# Patient Record
Sex: Male | Born: 1995 | Race: White | Hispanic: No | Marital: Single | Smoking: Current every day smoker
Health system: Southern US, Community
[De-identification: ages and names within clinical notes are randomized; demographics above are authoritative.]

---
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
## 2017-06-14 ENCOUNTER — Encounter (HOSPITAL_COMMUNITY): Payer: Self-pay | Admitting: *Deleted

## 2017-06-14 ENCOUNTER — Emergency Department (HOSPITAL_COMMUNITY)
Admission: EM | Admit: 2017-06-14 | Discharge: 2017-06-14 | Disposition: A | Discharge status: Home / Self Care | Payer: Self-pay | Attending: Emergency Medicine | Admitting: Emergency Medicine

## 2017-06-14 DIAGNOSIS — M545 Low back pain, unspecified: Secondary | ICD-10-CM

## 2017-06-14 DIAGNOSIS — F17200 Nicotine dependence, unspecified, uncomplicated: Secondary | ICD-10-CM | POA: Insufficient documentation

## 2017-06-14 MED ORDER — METHOCARBAMOL 500 MG PO TABS: 500.0000 mg | ORAL_TABLET | Freq: Two times a day (BID) | ORAL | 0 refills | Status: AC

## 2017-06-14 NOTE — Discharge Instructions (Addendum)
Please read attached information. If you experience any new or worsening signs or symptoms please return to the emergency room for evaluation. Please follow-up with your primary care provider or specialist as discussed. Please use medication prescribed only as directed and discontinue taking if you have any concerning signs or symptoms.   °

## 2017-06-14 NOTE — ED Provider Notes (Signed)
[facility] Note   CSN: [id number] Arrival date & time: 06/14/17  1101     History   Chief Complaint Chief Complaint  Patient presents with  . Back Pain    HPI Tommy Awkwardustin Campbell is a 22 y.o. male.  HPI  22 year old male presents today with complaints of right-sided back pain.  Patient notes approximately 1 week ago he woke up with right lower lateral lumbar and thoracic back pain.  He notes that he felt stiff and could not get out of bed.  He notes that after resting the symptoms improved and went back to work.  He notes worsening symptoms with pain, worse with flexion, worse with lying flat.  Patient denies any abdominal pain nausea vomiting, denies any dysuria, lower extremity loss of sensation strength and motor function.  Patient denies any acute injuries to the lower back.  Patient reports taking 400 mg ibuprofen in the morning.  History reviewed. No pertinent past medical history.  There are no active problems to display for this patient.   History reviewed. No pertinent surgical history.      Home Medications    Prior to Admission medications   Medication Sig Start Date End Date Taking? Authorizing Provider  ibuprofen (ADVIL,MOTRIN) 200 MG tablet Take 400-800 mg by mouth every 6 (six) hours as needed for mild pain.   Yes [provider]  methocarbamol (ROBAXIN) 500 MG tablet Take 1 tablet (500 mg total) by mouth 2 (two) times daily. 06/14/17   Eyvonne MechanicHedges, Randon Somera, PA-C    Family History History reviewed. No pertinent family history.  Social History Social History   Tobacco Use  . Smoking status: Current Every Day Smoker  . Smokeless tobacco: Never Used  Substance Use Topics  . Alcohol use: Not on file  . Drug use: Not on file     Allergies   Patient has no known allergies.   Review of Systems Review of Systems  All other systems reviewed and are negative.  Physical Exam Updated Vital Signs BP  (!) 147/93 (BP Location: Right Arm)   Pulse 83   Temp 98.3 F (36.8 C) (Oral)   Resp 20   SpO2 98%   Physical Exam  Constitutional: He is oriented to person, place, and time. He appears well-developed and well-nourished.  HENT:  Head: Normocephalic and atraumatic.  Eyes: Pupils are equal, round, and reactive to light. Conjunctivae are normal. Right eye exhibits no discharge. Left eye exhibits no discharge. No scleral icterus.  Neck: Normal range of motion. No JVD present. No tracheal deviation present.  Pulmonary/Chest: Effort normal. No stridor.  Abdominal: Soft. He exhibits no distension and no mass. There is no tenderness. There is no rebound and no guarding. No hernia.  Musculoskeletal:  TTP of left right lateral musculature of the lower thoracic and upper lumbar region-no midline CT or L-spine tenderness, patellar reflexes 2+ bilateral distal sensation strength and motor function intact, straight leg negative bilateral - no rashes or signs of infection   Neurological: He is alert and oriented to person, place, and time. Coordination normal.  Psychiatric: He has a normal mood and affect. His behavior is normal. Judgment and thought content normal.  Nursing note and vitals reviewed.    ED Treatments / Results  Labs (all labs ordered are listed, but only abnormal results are displayed) Labs Reviewed - No data to display  EKG None  Radiology No results found.  Procedures Procedures (including critical care time)  Medications  Ordered in ED Medications - No data to display   Initial Impression / Assessment and Plan / ED Course  I have reviewed the triage vital signs and the nursing notes.  Pertinent labs & imaging results that were available during my care of the patient were reviewed by me and considered in my medical decision making (see chart for details).     Final Clinical Impressions(s) / ED Diagnoses   Final diagnoses:  Acute right-sided low back pain without  sciatica   Labs:   Imaging:  Consults:  Therapeutics:  Discharge Meds: robaxin   Assessment/Plan: 22 year old male presents today with likely muscular back pain.  He is very well-appearing in no acute distress.  He has reproducible tenderness with palpation of the back no signs of infection.  Patient has no red flags here today, he will be treated with muscle relaxers symptomatic care and strict return precautions..  Low suspicion for any intra-abdominal pathology.  Patient verbalized understanding and agreement to today's plan had no further questions or concerns.   ED Discharge Orders        Ordered    methocarbamol (ROBAXIN) 500 MG tablet  2 times daily     06/14/17 1250       Eyvonne Mechanic, PA-C 06/14/17 1250    Rolan Bucco, MD 06/14/17 1517

## 2017-06-14 NOTE — ED Triage Notes (Signed)
Pt in c/o right sided lower back pain, thinks he injured his back at work, no distress noted, ambulatory to room, pain worse with movement
